# Patient Record
Sex: Male | Born: 2000 | Race: White | Hispanic: No | Marital: Single | Smoking: Never smoker
Health system: Southern US, Community
[De-identification: ages and names within clinical notes are randomized; demographics above are authoritative.]

## PROBLEM LIST (undated history)

## (undated) DIAGNOSIS — F909 Attention-deficit hyperactivity disorder, unspecified type: Secondary | ICD-10-CM

## (undated) HISTORY — PX: HERNIA REPAIR: SHX51

---
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
## 2022-01-04 DIAGNOSIS — F840 Autistic disorder: Secondary | ICD-10-CM | POA: Diagnosis not present

## 2022-02-01 DIAGNOSIS — F840 Autistic disorder: Secondary | ICD-10-CM | POA: Diagnosis not present

## 2022-07-13 DIAGNOSIS — J029 Acute pharyngitis, unspecified: Secondary | ICD-10-CM | POA: Diagnosis not present

## 2022-07-27 DIAGNOSIS — Z0000 Encounter for general adult medical examination without abnormal findings: Secondary | ICD-10-CM | POA: Diagnosis not present

## 2023-07-29 DIAGNOSIS — Z23 Encounter for immunization: Secondary | ICD-10-CM | POA: Diagnosis not present

## 2023-07-29 DIAGNOSIS — Z0000 Encounter for general adult medical examination without abnormal findings: Secondary | ICD-10-CM | POA: Diagnosis not present
